# Patient Record
Sex: Male | Born: 1945 | Race: Black or African American | Hispanic: No | Marital: Single
Health system: Southern US, Community
[De-identification: ages and names within clinical notes are randomized; demographics above are authoritative.]

---

## 2013-06-01 DIAGNOSIS — M4807 Spinal stenosis, lumbosacral region: Secondary | ICD-10-CM | POA: Insufficient documentation

## 2016-08-15 DIAGNOSIS — J8410 Pulmonary fibrosis, unspecified: Secondary | ICD-10-CM | POA: Insufficient documentation

## 2016-08-15 DIAGNOSIS — J449 Chronic obstructive pulmonary disease, unspecified: Secondary | ICD-10-CM | POA: Insufficient documentation

## 2016-08-15 DIAGNOSIS — J479 Bronchiectasis, uncomplicated: Secondary | ICD-10-CM | POA: Insufficient documentation

## 2016-08-26 DIAGNOSIS — I872 Venous insufficiency (chronic) (peripheral): Secondary | ICD-10-CM | POA: Insufficient documentation

## 2016-08-26 DIAGNOSIS — R972 Elevated prostate specific antigen [PSA]: Secondary | ICD-10-CM | POA: Insufficient documentation

## 2016-08-26 DIAGNOSIS — E782 Mixed hyperlipidemia: Secondary | ICD-10-CM | POA: Insufficient documentation

## 2017-07-21 DIAGNOSIS — I712 Thoracic aortic aneurysm, without rupture, unspecified: Secondary | ICD-10-CM | POA: Insufficient documentation

## 2017-07-29 DIAGNOSIS — S22000A Wedge compression fracture of unspecified thoracic vertebra, initial encounter for closed fracture: Secondary | ICD-10-CM | POA: Insufficient documentation

## 2017-07-29 DIAGNOSIS — I10 Essential (primary) hypertension: Secondary | ICD-10-CM | POA: Insufficient documentation

## 2017-10-22 DIAGNOSIS — C61 Malignant neoplasm of prostate: Secondary | ICD-10-CM | POA: Insufficient documentation

## 2018-06-09 DIAGNOSIS — G894 Chronic pain syndrome: Secondary | ICD-10-CM | POA: Insufficient documentation

## 2018-06-09 DIAGNOSIS — E119 Type 2 diabetes mellitus without complications: Secondary | ICD-10-CM | POA: Insufficient documentation

## 2018-06-10 DIAGNOSIS — I7121 Aneurysm of the ascending aorta, without rupture: Secondary | ICD-10-CM | POA: Insufficient documentation

## 2018-06-10 DIAGNOSIS — I2510 Atherosclerotic heart disease of native coronary artery without angina pectoris: Secondary | ICD-10-CM | POA: Insufficient documentation

## 2018-07-15 DIAGNOSIS — Z79899 Other long term (current) drug therapy: Secondary | ICD-10-CM | POA: Insufficient documentation

## 2018-07-15 DIAGNOSIS — I651 Occlusion and stenosis of basilar artery: Secondary | ICD-10-CM | POA: Insufficient documentation

## 2018-10-27 DIAGNOSIS — M722 Plantar fascial fibromatosis: Secondary | ICD-10-CM | POA: Insufficient documentation

## 2018-10-27 DIAGNOSIS — M5416 Radiculopathy, lumbar region: Secondary | ICD-10-CM | POA: Insufficient documentation

## 2019-11-09 ENCOUNTER — Other Ambulatory Visit: Payer: Self-pay

## 2019-11-09 ENCOUNTER — Emergency Department (HOSPITAL_BASED_OUTPATIENT_CLINIC_OR_DEPARTMENT_OTHER)
Admission: EM | Admit: 2019-11-09 | Discharge: 2019-11-09 | Disposition: A | Discharge status: Home / Self Care | Payer: Medicare Other | Attending: Emergency Medicine | Admitting: Emergency Medicine

## 2019-11-09 ENCOUNTER — Emergency Department (HOSPITAL_BASED_OUTPATIENT_CLINIC_OR_DEPARTMENT_OTHER): Payer: Medicare Other

## 2019-11-09 DIAGNOSIS — E119 Type 2 diabetes mellitus without complications: Secondary | ICD-10-CM | POA: Diagnosis not present

## 2019-11-09 DIAGNOSIS — I10 Essential (primary) hypertension: Secondary | ICD-10-CM | POA: Insufficient documentation

## 2019-11-09 DIAGNOSIS — J449 Chronic obstructive pulmonary disease, unspecified: Secondary | ICD-10-CM | POA: Insufficient documentation

## 2019-11-09 DIAGNOSIS — Z8546 Personal history of malignant neoplasm of prostate: Secondary | ICD-10-CM | POA: Insufficient documentation

## 2019-11-09 DIAGNOSIS — G459 Transient cerebral ischemic attack, unspecified: Secondary | ICD-10-CM | POA: Insufficient documentation

## 2019-11-09 DIAGNOSIS — Z7982 Long term (current) use of aspirin: Secondary | ICD-10-CM | POA: Insufficient documentation

## 2019-11-09 DIAGNOSIS — R55 Syncope and collapse: Secondary | ICD-10-CM | POA: Diagnosis present

## 2019-11-09 DIAGNOSIS — Z7984 Long term (current) use of oral hypoglycemic drugs: Secondary | ICD-10-CM | POA: Insufficient documentation

## 2019-11-09 DIAGNOSIS — R42 Dizziness and giddiness: Secondary | ICD-10-CM | POA: Diagnosis not present

## 2019-11-09 DIAGNOSIS — Z79899 Other long term (current) drug therapy: Secondary | ICD-10-CM | POA: Diagnosis not present

## 2019-11-09 LAB — COMPREHENSIVE METABOLIC PANEL
ALT: 21 U/L (ref 0–44)
AST: 22 U/L (ref 15–41)
Albumin: 3.7 g/dL (ref 3.5–5.0)
Alkaline Phosphatase: 77 U/L (ref 38–126)
Anion gap: 11 (ref 5–15)
BUN: 14 mg/dL (ref 8–23)
CO2: 27 mmol/L (ref 22–32)
Calcium: 8.7 mg/dL — ABNORMAL LOW (ref 8.9–10.3)
Chloride: 102 mmol/L (ref 98–111)
Creatinine, Ser: 0.89 mg/dL (ref 0.61–1.24)
GFR calc Af Amer: >60 mL/min (ref >60)
GFR calc non Af Amer: >60 mL/min (ref >60)
Glucose, Bld: 101 mg/dL — ABNORMAL HIGH (ref 70–99)
Potassium: 3.5 mmol/L (ref 3.5–5.1)
Sodium: 140 mmol/L (ref 135–145)
Total Bilirubin: 0.8 mg/dL (ref 0.3–1.2)
Total Protein: 6.9 g/dL (ref 6.5–8.1)

## 2019-11-09 LAB — URINALYSIS, ROUTINE W REFLEX MICROSCOPIC
Bilirubin Urine: NEGATIVE
Glucose, UA: NEGATIVE mg/dL
Hgb urine dipstick: NEGATIVE
Ketones, ur: NEGATIVE mg/dL
Leukocytes,Ua: NEGATIVE
Nitrite: NEGATIVE
Protein, ur: NEGATIVE mg/dL
Specific Gravity, Urine: >1.03 — ABNORMAL HIGH (ref 1.005–1.030)
pH: 5 (ref 5.0–8.0)

## 2019-11-09 LAB — CBC
HCT: 40.4 % (ref 39.0–52.0)
Hemoglobin: 13.1 g/dL (ref 13.0–17.0)
MCH: 29 pg (ref 26.0–34.0)
MCHC: 32.4 g/dL (ref 30.0–36.0)
MCV: 89.4 fL (ref 80.0–100.0)
Platelets: 214 10*3/uL (ref 150–400)
RBC: 4.52 MIL/uL (ref 4.22–5.81)
RDW: 14.2 % (ref 11.5–15.5)
WBC: 3.7 10*3/uL — ABNORMAL LOW (ref 4.0–10.5)
nRBC: 0 % (ref 0.0–0.2)

## 2019-11-09 LAB — TROPONIN I (HIGH SENSITIVITY)
Troponin I (High Sensitivity): 8 ng/L (ref <18)
Troponin I (High Sensitivity): 8 ng/L (ref <18)

## 2019-11-09 NOTE — Discharge Instructions (Addendum)
1.  Continue to take your aspirin and Plavix daily as prescribed.  Schedule a follow-up appointment with your family doctor within the next 2 to 5 days. 2.  Return to emergency department immediately if you develop stroke symptoms.  The treatment of stroke is very time sensitive.  The sooner you arrived to the emergency department the more options are available for treatment.

## 2019-11-09 NOTE — ED Notes (Signed)
Pt lying flat for 70mins for orthostatics

## 2019-11-09 NOTE — ED Triage Notes (Signed)
Pt here after near syncopal episode yesterday. Hx of stroke.

## 2019-11-09 NOTE — ED Notes (Signed)
Patient transported to CT 

## 2019-11-09 NOTE — ED Provider Notes (Signed)
[facility]ENC[facility] DEPARTMENT Provider Note   CSN: [id number] Arrival date & time: 11/09/19  4098     History Chief Complaint  Patient presents with  . Near Syncope  . Dizziness    Edwin Guzman is a 74 y.o. male.  HPI Patient had an episode yesterday that lasted approximately 2 hours.  From about 10 AM until noon he felt very off balance and as if he might pass out or fall.  He reports he was having to use the walls to steady himself.  He does not note a specific weakness numbness or tingling to his extremities.  However, he reports he has had a stroke in the past and he thought this was similar.  By noon, the symptoms had resolved.  He reports he has felt fine since that time.  He is done usual activities without difficulty.  He has not had fever chills or been sick recently.  No chest pain or shortness of breath.  No headache.  Patient had called his son yesterday when he was having symptoms.  His son got the message today and felt that his dad should get checked because of his history of prior stroke.  Patient reports he is compliant with his aspirin and Plavix combination.  Patient is aware that he has various areas of cerebral vascular stenosis.  He reports previously he had been told that trying to stent those vessels could result in severe complications and at this time he is supposed to be taking aspirin and Plavix to prevent stroke. Patient reports he has always had a very slow heart rate, even since he was young.  He reports the doctor would have him get up move around and his heart rate will pick up relative to his activity level.  He denies problems with heart racing or skipping.  No palpitations.  No exertional chest pain, shortness of breath or lightheadedness with exertion.    No past medical history on file.  Patient Active Problem List   Diagnosis Date Noted  . Lumbar radiculopathy, right 10/27/2018  . Plantar fasciitis, right 10/27/2018  . 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 High risk medication use 07/15/2018  . Coronary artery calcification seen on CT scan 06/10/2018  . Ascending aortic aneurysm ([place]) 06/10/2018  . Chronic pain syndrome 06/09/2018  . Type 2 diabetes mellitus ([place]) 06/09/2018  . Prostate carcinoma ([place]) 10/22/2017  . Compression fracture of body of thoracic vertebra ([place]) 07/29/2017  . Essential hypertension 07/29/2017  . Thoracic aortic aneurysm without rupture ([place]) 07/21/2017  . Mixed hyperlipidemia 08/26/2016  . PSA elevation 08/26/2016  . Venous insufficiency of both lower extremities 08/26/2016  . Bronchiectasis without complication ([place]) 11/91/4782  . 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 Lumbosacral spinal stenosis 06/01/2013  . Radicular low back pain 02/24/2013  . History of stroke 02/19/2011  . Low back pain radiating to both legs 02/19/2011         No family history on file.  Social History   Tobacco Use  . Smoking status: Not on file  Substance Use Topics  . Alcohol use: Not on file  . Drug use: Not on file    Home Medications Prior to Admission medications   Medication Sig Start Date End Date Taking? Authorizing Provider  aspirin 81 MG EC tablet Take by mouth. 03/05/18  Yes [provider]  clopidogrel (PLAVIX) 75 MG tablet Take 1 tablet by mouth daily. 07/03/15  Yes [provider]  diazepam (VALIUM) 2 MG tablet Take by mouth. 07/02/16  Yes [provider]  gabapentin (NEURONTIN) 800 MG tablet Take 1 tablet by mouth 3 (three) times daily. 06/08/19  Yes [provider]  hydrochlorothiazide (HYDRODIURIL) 25 MG tablet TAKE 1 TABLET(25 MG) BY MOUTH every morning 05/28/16  Yes [provider]  HYDROcodone-acetaminophen (NORCO) 10-325 MG tablet 1 tablet every 8 hours as needed for back pain 10/29/19  Yes [provider]  metFORMIN (GLUCOPHAGE) 500 MG tablet Take by mouth. 09/05/19 09/04/20 Yes [provider]  leuprolide (LUPRON) 7.5 MG injection Inject into the muscle. 10/23/17   [provider]    Allergies    Hydrocodone-acetaminophen  Review of Systems   Review of Systems 10 systems reviewed and negative except as per HPI. Physical Exam Updated Vital Signs BP 140/81   Pulse (!) 43   Temp 98 F (36.7 C) (Oral)   Resp 17   SpO2 100%   Physical Exam Constitutional:      Comments: Patient is alert and nontoxic.  No respiratory distress.  Clinically well in appearance.  HENT:     Head: Normocephalic and atraumatic.     Mouth/Throat:     Mouth: Mucous membranes are moist.     Pharynx: Oropharynx is clear.  Eyes:     Extraocular Movements: Extraocular movements intact.     Conjunctiva/sclera: Conjunctivae normal.     Pupils: Pupils are equal, round, and reactive to light.  Cardiovascular:     Rate and Rhythm: Regular rhythm. Bradycardia present.  Pulmonary:     Effort: Pulmonary effort is normal.     Breath sounds: Normal breath sounds.  Abdominal:     General: There is no distension.     Palpations: Abdomen is soft.     Tenderness: There is no abdominal tenderness. There is no guarding.  Musculoskeletal:        General: No swelling or tenderness. Normal range of motion.     Cervical back: Neck supple.  Skin:    General: Skin is warm and dry.  Neurological:     Comments: Patient is alert and appropriate.  Speech has normal content.  Patient is voice is fairly gravelly and speech pattern is somewhat difficult to understand but normal content without slurring.  Cranial nerves intact.  Grip strength 5\5 bilaterally.  Patient can elevate each lower extremity off the bed and hold against resistance.  Intact finger-nose exam.  Psychiatric:        Mood and Affect: Mood normal.     ED Results / Procedures / Treatments   Labs (all labs ordered are listed, but only abnormal results are displayed) Labs Reviewed  COMPREHENSIVE METABOLIC PANEL - Abnormal;  Notable for the following components:      Result Value   Glucose, Bld 101 (*)    Calcium 8.7 (*)    All other components within normal limits  CBC - Abnormal; Notable for the following components:   WBC 3.7 (*)    All other components within normal limits  URINALYSIS, ROUTINE W REFLEX MICROSCOPIC - Abnormal; Notable for the following components:   Specific Gravity, Urine >1.030 (*)    All other components within normal limits  TROPONIN I (HIGH SENSITIVITY)  TROPONIN I (HIGH SENSITIVITY)    EKG EKG Interpretation  Date/Time:  Wednesday November 09 2019 10:10:38 EDT Ventricular Rate:  50 PR Interval:    QRS Duration: 124 QT Interval:  489 QTC Calculation: 446 R  Axis:   -32 Text Interpretation: Sinus rhythm Borderline prolonged PR interval Nonspecific intraventricular conduction delay Borderline T abnormalities, anterior leads agree. no old comparison Confirmed by Charlesetta Shanks [phone]) on 11/09/2019 10:44:57 AM   Radiology DG Chest 1 View  Result Date: 11/09/2019 CLINICAL DATA:  Near syncope EXAM: CHEST  1 VIEW COMPARISON:  March 03, 2018 chest radiograph; chest CT Oct 18, 2017 FINDINGS: There is chronic consolidation in the right mid lung region. No new opacity evident. Left lung clear. Heart is borderline enlarged with pulmonary vascularity normal. No evident adenopathy. No bone lesions. IMPRESSION: Chronic consolidation in the right mid lung which may represent residua of apparent lipoid pneumonia seen on prior CT examination. No new opacity evident. Stable cardiac prominence. No adenopathy. Electronically Signed   By: Lowella Grip III M.D.   On: 11/09/2019 11:40   CT Head Wo Contrast  Result Date: 11/09/2019 CLINICAL DATA:  Ataxia EXAM: CT HEAD WITHOUT CONTRAST TECHNIQUE: Contiguous axial images were obtained from the base of the skull through the vertex without intravenous contrast. COMPARISON:  March 03, 2018 FINDINGS: Brain: The ventricles are normal in size and  configuration. There is moderate frontal atrophy bilaterally as well as moderately severe cerebellar atrophy bilaterally. There is no mass, hemorrhage, extra-axial fluid collection, or midline shift. There is decreased attenuation in the right mid pons consistent with an age uncertain focal infarct. Elsewhere there is slight small vessel disease in the centra semiovale bilaterally. Vascular: No hyperdense vessel. There is calcification in each distal vertebral artery and carotid siphon region. Skull: Bony calvarium appears intact. Sinuses/Orbits: Visualized paranasal sinuses are clear. Visualized orbits appear symmetric bilaterally. Other: Mastoid air cells are clear. IMPRESSION: Bilateral frontal lobe atrophy and cerebellar atrophy. Ventricles normal in size and configuration. Focal age uncertain and potentially recent infarct in the right mid pons. Elsewhere slight periventricular small vessel disease noted. No mass or hemorrhage. Foci of arterial vascular calcification noted at several sites. Electronically Signed   By: Lowella Grip III M.D.   On: 11/09/2019 11:38    Procedures Procedures (including critical care time)  Medications Ordered in ED Medications - No data to display  ED Course  I have reviewed the triage vital signs and the nursing notes.  Pertinent labs & imaging results that were available during my care of the patient were reviewed by me and considered in my medical decision making (see chart for details).    MDM Rules/Calculators/A&P                         Patient presents today after symptoms that occurred yesterday.  He had a time-limited of episode of about 2 hours of dizziness and unsteadiness.  He does describe it as being very similar to a prior episode of stroke.  Since noon yesterday, patient has been asymptomatic.  Patient has extensive stroke history and significant cerebral stenosis.  He certainly has high risk for TIA and stroke.  Patient reports he is compliant  with his aspirin and Plavix.  At this time, with symptoms resolved and known underlying disease at maximal treatment, patient is stable for discharge.  I have reviewed with the patient and his son the time sensitive nature of stroke and the treatment options that might be available if another event occurs, recognizing, that with the severity of pre-existing disease there is also significant risk associated with the treatment. I have reviewed this with the  patient's son and he is comfortable taking the  patient home.  Final Clinical Impression(s) / ED Diagnoses Final diagnoses:  TIA (transient ischemic attack)    Rx / DC Orders ED Discharge Orders    None       Charlesetta Shanks, MD 11/09/19 1451

## 2022-01-28 IMAGING — CT CT HEAD W/O CM
3 series · 15 of 47 positions shown, 18 images · non-contrast
Comparison: March 03, 2018

CLINICAL DATA: Ataxia

EXAM:
CT HEAD WITHOUT CONTRAST
TECHNIQUE: Contiguous axial images were obtained from the base of the skull
through the vertex without intravenous contrast.

[Series 2: head wo · axial · 0.46mm/px · z∈[-145,+0]mm · 9 of 35 slices shown, 12 images]
[im 3/35  brain]
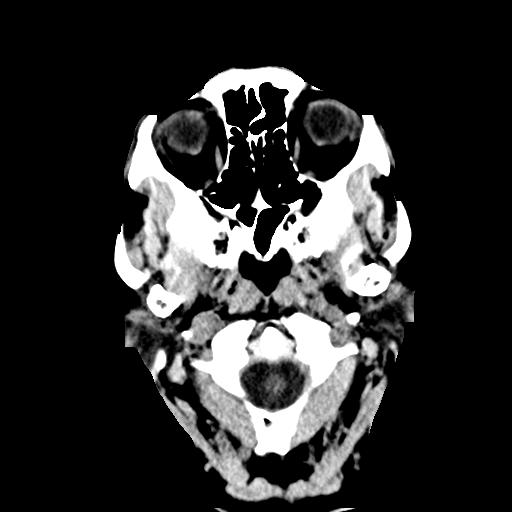
[im 3/35  bone]
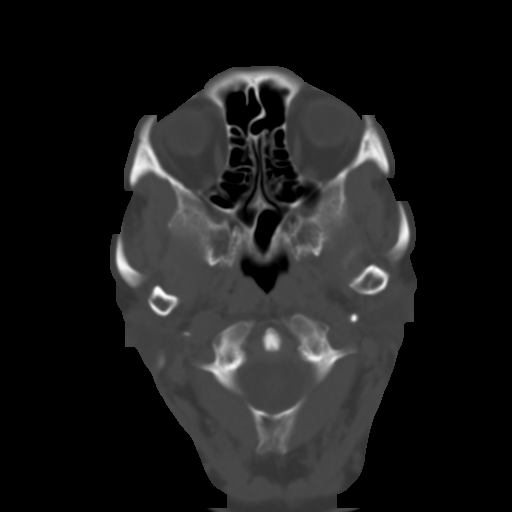
[im 6/35  brain]
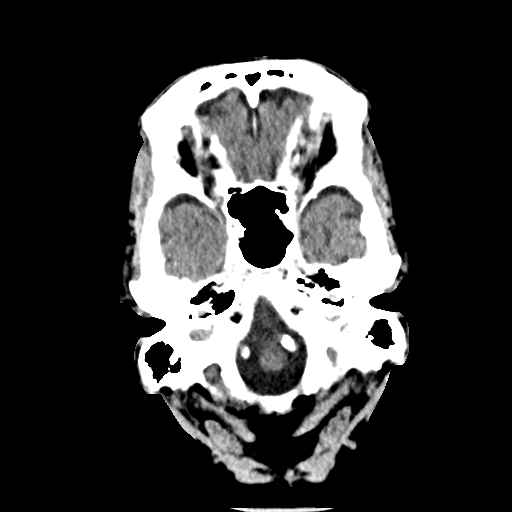
[im 10/35  brain]
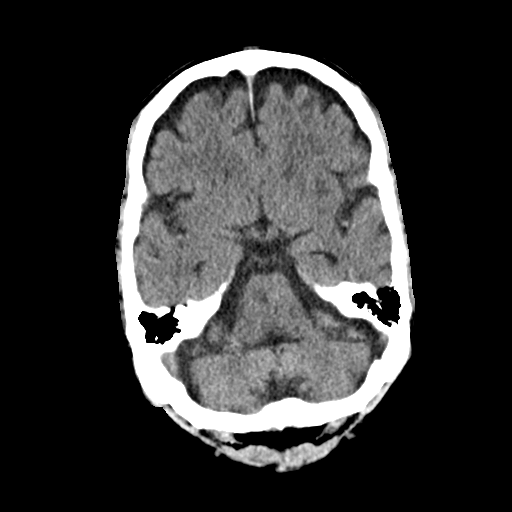
[im 13/35  brain]
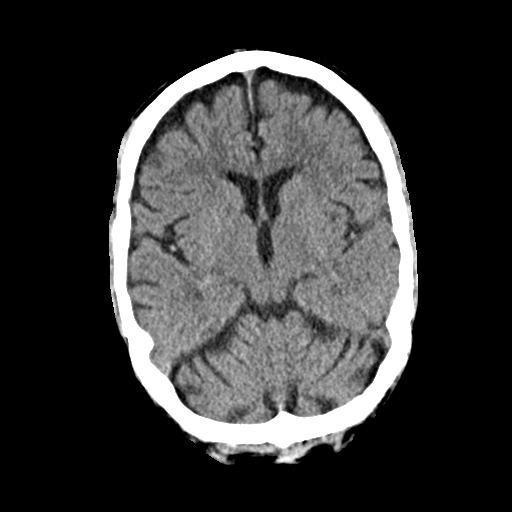
[im 18/35  brain]
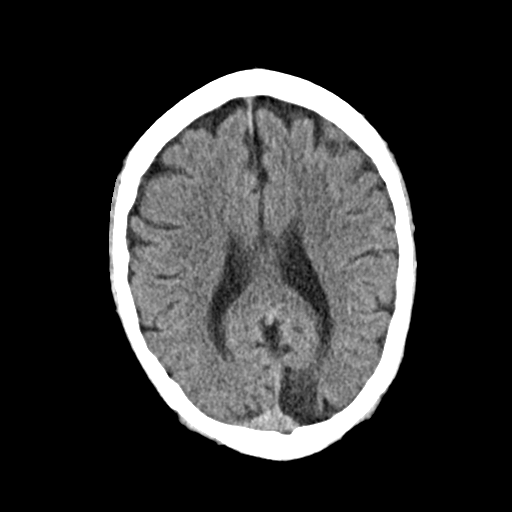
[im 18/35  bone]
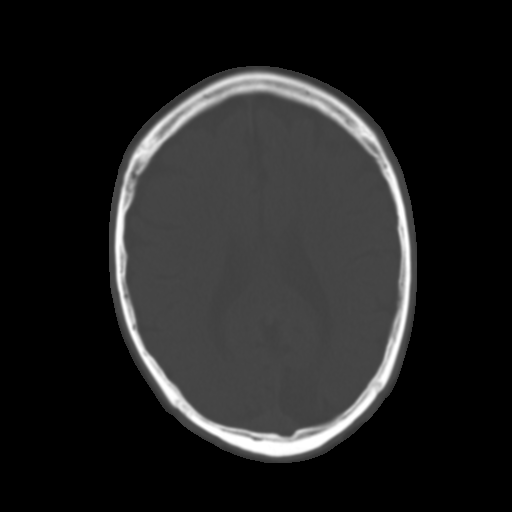
[im 22/35  brain]
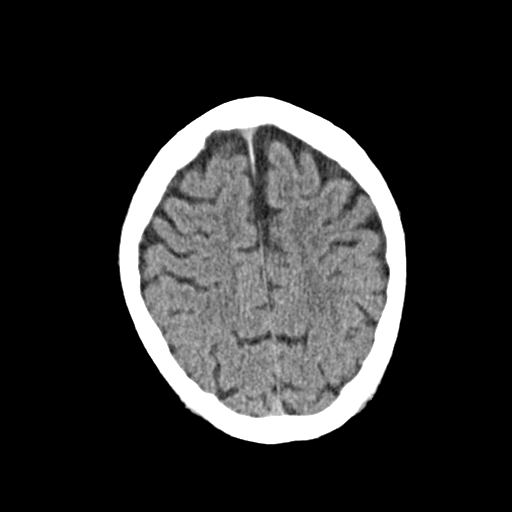
[im 25/35  brain]
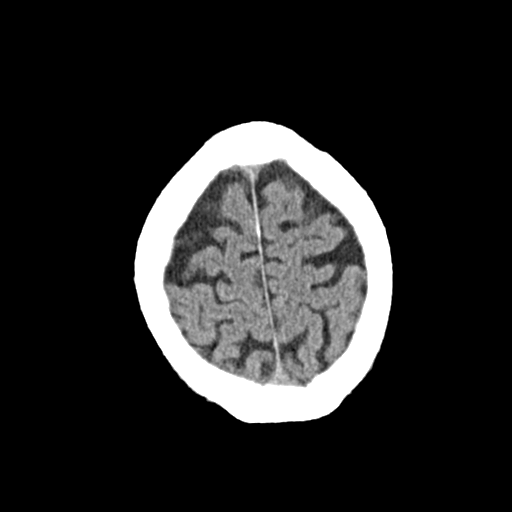
[im 29/35  brain]
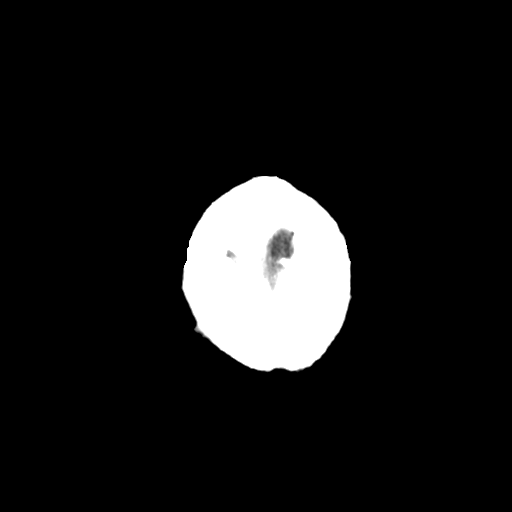
[im 32/35  brain]
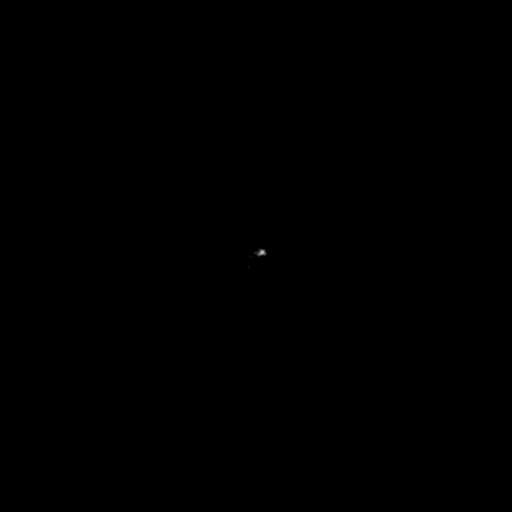
[im 32/35  bone]
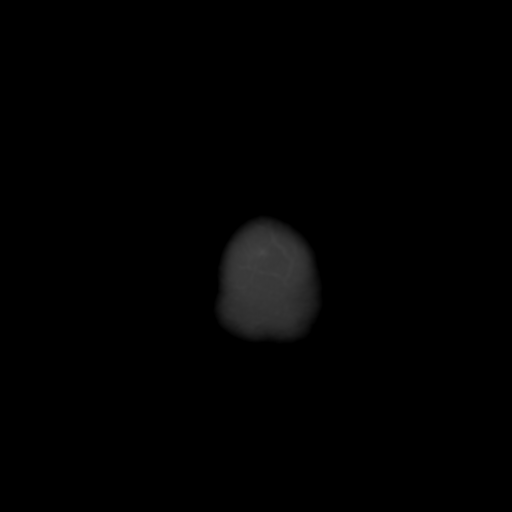

[Series 4: coronal soft · coronal · 0.33mm/px · 3 of 70 slices shown]
[im 24/70  brain]
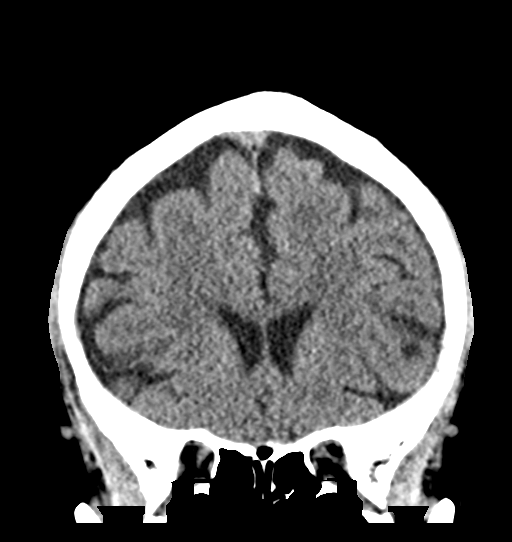
[im 31/70  brain]
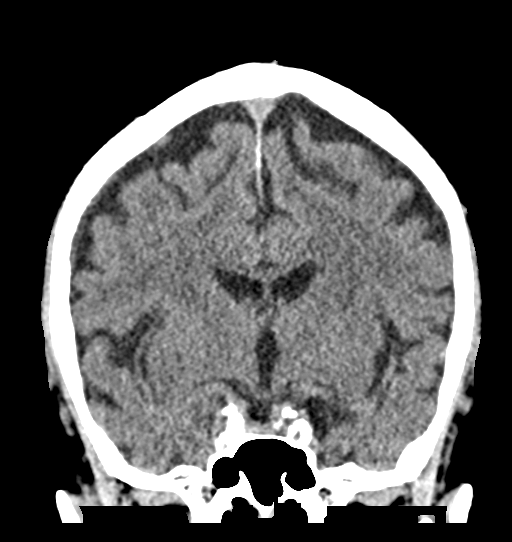
[im 39/70  brain]
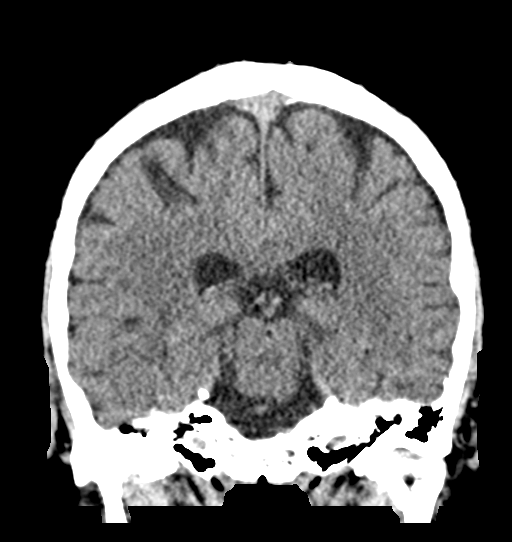

[Series 5: sag soft · sagittal · 0.34mm/px · 3 of 54 slices shown]
[im 18/54  brain]
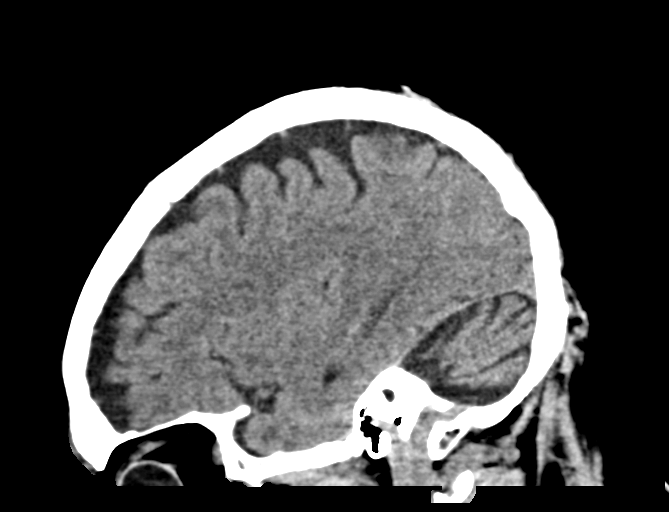
[im 27/54  brain]
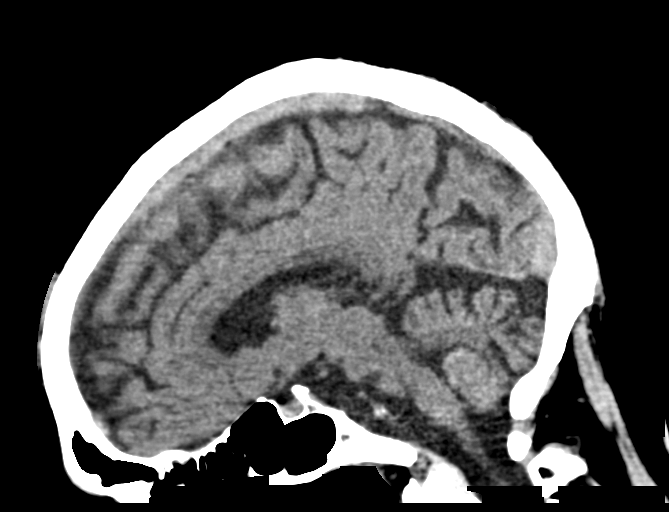
[im 36/54  brain]
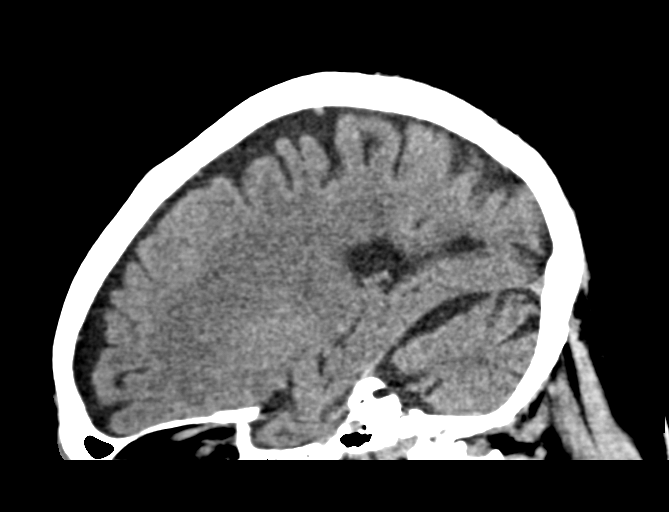

[15 of 47 positions shown; findings below may reference images not displayed]

FINDINGS: Brain: The ventricles are normal in size and configuration. There is
moderate frontal atrophy bilaterally as well as moderately severe
cerebellar atrophy bilaterally. There is no mass, hemorrhage,
extra-axial fluid collection, or midline shift. There is decreased
attenuation in the right mid pons consistent with an age uncertain
focal infarct. Elsewhere there is slight small vessel disease in the
centra semiovale bilaterally.

Vascular: No hyperdense vessel. There is calcification in each
distal vertebral artery and carotid siphon region.

Skull: Bony calvarium appears intact.

Sinuses/Orbits: Visualized paranasal sinuses are clear. Visualized
orbits appear symmetric bilaterally.

Other: Mastoid air cells are clear.
IMPRESSION: Bilateral frontal lobe atrophy and cerebellar atrophy. Ventricles
normal in size and configuration.

Focal age uncertain and potentially recent infarct in the right mid
pons. Elsewhere slight periventricular small vessel disease noted.
No mass or hemorrhage.

Foci of arterial vascular calcification noted at several sites.

## 2022-04-02 ENCOUNTER — Encounter (HOSPITAL_BASED_OUTPATIENT_CLINIC_OR_DEPARTMENT_OTHER): Payer: Self-pay | Admitting: Pediatrics

## 2022-04-02 ENCOUNTER — Emergency Department (HOSPITAL_BASED_OUTPATIENT_CLINIC_OR_DEPARTMENT_OTHER): Payer: Medicare Other

## 2022-04-02 ENCOUNTER — Emergency Department (HOSPITAL_BASED_OUTPATIENT_CLINIC_OR_DEPARTMENT_OTHER)
Admission: EM | Admit: 2022-04-02 | Discharge: 2022-04-02 | Discharge status: Against Medical Advice | Payer: Medicare Other | Attending: Emergency Medicine | Admitting: Emergency Medicine

## 2022-04-02 ENCOUNTER — Other Ambulatory Visit: Payer: Self-pay

## 2022-04-02 DIAGNOSIS — I4510 Unspecified right bundle-branch block: Secondary | ICD-10-CM | POA: Diagnosis not present

## 2022-04-02 DIAGNOSIS — I498 Other specified cardiac arrhythmias: Secondary | ICD-10-CM | POA: Diagnosis not present

## 2022-04-02 DIAGNOSIS — Z5321 Procedure and treatment not carried out due to patient leaving prior to being seen by health care provider: Secondary | ICD-10-CM | POA: Insufficient documentation

## 2022-04-02 DIAGNOSIS — R222 Localized swelling, mass and lump, trunk: Secondary | ICD-10-CM | POA: Diagnosis present

## 2022-04-02 LAB — CBC
HCT: 43 % (ref 39.0–52.0)
Hemoglobin: 13.8 g/dL (ref 13.0–17.0)
MCH: 29 pg (ref 26.0–34.0)
MCHC: 32.1 g/dL (ref 30.0–36.0)
MCV: 90.3 fL (ref 80.0–100.0)
Platelets: 211 10*3/uL (ref 150–400)
RBC: 4.76 MIL/uL (ref 4.22–5.81)
RDW: 15.1 % (ref 11.5–15.5)
WBC: 3.9 10*3/uL — ABNORMAL LOW (ref 4.0–10.5)
nRBC: 0 % (ref 0.0–0.2)

## 2022-04-02 LAB — BASIC METABOLIC PANEL
Anion gap: 6 (ref 5–15)
BUN: 19 mg/dL (ref 8–23)
CO2: 26 mmol/L (ref 22–32)
Calcium: 8.5 mg/dL — ABNORMAL LOW (ref 8.9–10.3)
Chloride: 110 mmol/L (ref 98–111)
Creatinine, Ser: 1.2 mg/dL (ref 0.61–1.24)
GFR, Estimated: >60 mL/min (ref >60)
Glucose, Bld: 100 mg/dL — ABNORMAL HIGH (ref 70–99)
Potassium: 4.1 mmol/L (ref 3.5–5.1)
Sodium: 142 mmol/L (ref 135–145)

## 2022-04-02 LAB — PROTIME-INR
INR: 1.2 (ref 0.8–1.2)
Prothrombin Time: 14.7 seconds (ref 11.4–15.2)

## 2022-04-02 LAB — TROPONIN I (HIGH SENSITIVITY): Troponin I (High Sensitivity): 7 ng/L (ref <18)

## 2022-04-02 NOTE — ED Triage Notes (Signed)
Reported noticed some swelling on his chest this week; denies pain but stated he has some heart problem;

## 2022-04-02 NOTE — ED Triage Notes (Signed)
  Reported noticed some swelling on his chest this week; denies pain but stated he has some heart problem;
